# Patient Record
Sex: Female | Born: 1980 | Race: White | Hispanic: No | Marital: Married | Smoking: Former smoker
Health system: Southern US, Community
[De-identification: ages and names within clinical notes are randomized; demographics above are authoritative.]

---

## 2005-08-24 ENCOUNTER — Other Ambulatory Visit: Admission: RE | Admit: 2005-08-24 | Discharge: 2005-08-24 | Payer: Self-pay | Admitting: Obstetrics and Gynecology

## 2014-06-27 ENCOUNTER — Emergency Department (HOSPITAL_BASED_OUTPATIENT_CLINIC_OR_DEPARTMENT_OTHER)
Admission: EM | Admit: 2014-06-27 | Discharge: 2014-06-27 | Disposition: A | Discharge status: Home / Self Care | Payer: BC Managed Care – PPO | Attending: Emergency Medicine | Admitting: Emergency Medicine

## 2014-06-27 ENCOUNTER — Emergency Department (HOSPITAL_BASED_OUTPATIENT_CLINIC_OR_DEPARTMENT_OTHER): Payer: BC Managed Care – PPO

## 2014-06-27 ENCOUNTER — Encounter (HOSPITAL_BASED_OUTPATIENT_CLINIC_OR_DEPARTMENT_OTHER): Payer: Self-pay | Admitting: Emergency Medicine

## 2014-06-27 DIAGNOSIS — N80129 Deep endometriosis of ovary, unspecified ovary: Secondary | ICD-10-CM

## 2014-06-27 DIAGNOSIS — R1033 Periumbilical pain: Secondary | ICD-10-CM | POA: Diagnosis present

## 2014-06-27 DIAGNOSIS — N80109 Endometriosis of ovary, unspecified side, unspecified depth: Secondary | ICD-10-CM | POA: Diagnosis not present

## 2014-06-27 DIAGNOSIS — Z87891 Personal history of nicotine dependence: Secondary | ICD-10-CM | POA: Insufficient documentation

## 2014-06-27 DIAGNOSIS — Z3202 Encounter for pregnancy test, result negative: Secondary | ICD-10-CM | POA: Insufficient documentation

## 2014-06-27 DIAGNOSIS — N801 Endometriosis of ovary: Secondary | ICD-10-CM | POA: Diagnosis not present

## 2014-06-27 LAB — WET PREP, GENITAL
Trich, Wet Prep: NONE SEEN
YEAST WET PREP: NONE SEEN

## 2014-06-27 LAB — URINALYSIS, ROUTINE W REFLEX MICROSCOPIC
GLUCOSE, UA: NEGATIVE mg/dL
KETONES UR: 15 mg/dL — AB
Leukocytes, UA: NEGATIVE
Nitrite: NEGATIVE
PROTEIN: NEGATIVE mg/dL
Specific Gravity, Urine: 1.022 (ref 1.005–1.030)
Urobilinogen, UA: 1 mg/dL (ref 0.0–1.0)
pH: 5.5 (ref 5.0–8.0)

## 2014-06-27 LAB — COMPREHENSIVE METABOLIC PANEL
ALBUMIN: 4.4 g/dL (ref 3.5–5.2)
ALT: 7 U/L (ref 0–35)
ANION GAP: 13 (ref 5–15)
AST: 13 U/L (ref 0–37)
Alkaline Phosphatase: 48 U/L (ref 39–117)
BUN: 16 mg/dL (ref 6–23)
CALCIUM: 10.1 mg/dL (ref 8.4–10.5)
CHLORIDE: 104 meq/L (ref 96–112)
CO2: 24 mEq/L (ref 19–32)
CREATININE: 0.8 mg/dL (ref 0.50–1.10)
GFR calc Af Amer: >90 mL/min (ref >90)
Glucose, Bld: 92 mg/dL (ref 70–99)
Potassium: 3.9 mEq/L (ref 3.7–5.3)
Sodium: 141 mEq/L (ref 137–147)
Total Bilirubin: 0.9 mg/dL (ref 0.3–1.2)
Total Protein: 7.4 g/dL (ref 6.0–8.3)

## 2014-06-27 LAB — CBC WITH DIFFERENTIAL/PLATELET
Basophils Absolute: 0 10*3/uL (ref 0.0–0.1)
Basophils Relative: 0 % (ref 0–1)
EOS ABS: 0 10*3/uL (ref 0.0–0.7)
Eosinophils Relative: 0 % (ref 0–5)
HCT: 39.5 % (ref 36.0–46.0)
HEMOGLOBIN: 13.2 g/dL (ref 12.0–15.0)
Lymphocytes Relative: 18 % (ref 12–46)
Lymphs Abs: 1.3 10*3/uL (ref 0.7–4.0)
MCH: 28.4 pg (ref 26.0–34.0)
MCHC: 33.4 g/dL (ref 30.0–36.0)
MCV: 85.1 fL (ref 78.0–100.0)
MONOS PCT: 6 % (ref 3–12)
Monocytes Absolute: 0.5 10*3/uL (ref 0.1–1.0)
NEUTROS ABS: 5.8 10*3/uL (ref 1.7–7.7)
Neutrophils Relative %: 76 % (ref 43–77)
Platelets: 168 10*3/uL (ref 150–400)
RBC: 4.64 MIL/uL (ref 3.87–5.11)
RDW: 13.1 % (ref 11.5–15.5)
WBC: 7.6 10*3/uL (ref 4.0–10.5)

## 2014-06-27 LAB — PREGNANCY, URINE: Preg Test, Ur: NEGATIVE

## 2014-06-27 LAB — URINE MICROSCOPIC-ADD ON

## 2014-06-27 LAB — LIPASE, BLOOD: Lipase: 12 U/L (ref 11–59)

## 2014-06-27 MED ORDER — IOHEXOL 300 MG/ML  SOLN
100.0000 mL | Freq: Once | INTRAMUSCULAR | Status: AC | PRN
  Administered 2014-06-27: 100 mL via INTRAVENOUS

## 2014-06-27 MED ORDER — ONDANSETRON 4 MG PO TBDP
4.0000 mg | ORAL_TABLET | Freq: Once | ORAL | Status: AC
  Administered 2014-06-27: 4 mg via ORAL
  Filled 2014-06-27: qty 1

## 2014-06-27 MED ORDER — IOHEXOL 300 MG/ML  SOLN
50.0000 mL | Freq: Once | INTRAMUSCULAR | Status: AC | PRN
  Administered 2014-06-27: 50 mL via ORAL

## 2014-06-27 MED ORDER — IBUPROFEN 800 MG PO TABS: 800.0000 mg | ORAL_TABLET | Freq: Four times a day (QID) | ORAL | Status: AC | PRN

## 2014-06-27 NOTE — ED Provider Notes (Signed)
CSN: [id number]     Arrival date & time 06/27/14  1029 History   First MD Initiated Contact with Patient 06/27/14 1140     Chief Complaint  Patient presents with  . Abdominal Pain     (Consider location/radiation/quality/duration/timing/severity/associated sxs/prior Treatment) Patient is a 33 y.o. female presenting with abdominal pain. The history is provided by the patient. No language interpreter was used.  Abdominal Pain Pain location:  Periumbilical and RLQ Pain quality: aching   Pain radiates to:  Does not radiate Pain severity:  Moderate Onset quality:  Gradual Duration:  20 hours Timing:  Constant Progression:  Worsening Chronicity:  New Context: not sick contacts and not suspicious food intake   Relieved by:  Nothing Worsened by:  Movement Ineffective treatments:  None tried Associated symptoms: anorexia and nausea   Associated symptoms: no chest pain, no chills, no cough, no diarrhea, no dysuria, no fatigue, no fever, no flatus, no hematemesis, no hematochezia, no shortness of breath, no sore throat, no vaginal discharge and no vomiting     History reviewed. No pertinent past medical history. History reviewed. No pertinent past surgical history. No family history on file. History  Substance Use Topics  . Smoking status: Former Games developer  . Smokeless tobacco: Not on file  . Alcohol Use: Not on file   OB History   Grav Para Term Preterm Abortions TAB SAB Ect Mult Living                 Review of Systems  Constitutional: Negative for fever, chills, diaphoresis, activity change, appetite change and fatigue.  HENT: Negative for congestion, facial swelling, rhinorrhea and sore throat.   Eyes: Negative for photophobia and discharge.  Respiratory: Negative for cough, chest tightness and shortness of breath.   Cardiovascular: Negative for chest pain, palpitations and leg swelling.  Gastrointestinal: Positive for nausea, abdominal pain and anorexia. Negative for vomiting,  diarrhea, hematochezia, flatus and hematemesis.  Endocrine: Negative for polydipsia and polyuria.  Genitourinary: Negative for dysuria, frequency, vaginal discharge, difficulty urinating and pelvic pain.  Musculoskeletal: Negative for arthralgias, back pain, neck pain and neck stiffness.  Skin: Negative for color change and wound.  Allergic/Immunologic: Negative for immunocompromised state.  Neurological: Negative for facial asymmetry, weakness, numbness and headaches.  Hematological: Does not bruise/bleed easily.  Psychiatric/Behavioral: Negative for confusion and agitation.      Allergies  Review of patient's allergies indicates no known allergies.  Home Medications   Prior to Admission medications   Medication Sig Start Date End Date Taking? Authorizing Provider  ibuprofen (ADVIL,MOTRIN) 800 MG tablet Take 1 tablet (800 mg total) by mouth every 6 (six) hours as needed. 06/27/14   Toy Cookey, MD   BP 124/78  Pulse 76  Temp(Src) 98 F (36.7 C) (Oral)  Resp 17  Wt 108 lb (48.988 kg)  SpO2 100%  LMP 06/25/2014 Physical Exam  Constitutional: She is oriented to person, place, and time. She appears well-developed and well-nourished. No distress.  HENT:  Head: Normocephalic and atraumatic.  Mouth/Throat: No oropharyngeal exudate.  Eyes: Pupils are equal, round, and reactive to light.  Neck: Normal range of motion. Neck supple.  Cardiovascular: Normal rate, regular rhythm and normal heart sounds.  Exam reveals no gallop and no friction rub.   No murmur heard. Pulmonary/Chest: Effort normal and breath sounds normal. No respiratory distress. She has no wheezes. She has no rales.  Abdominal: Soft. Bowel sounds are normal. She exhibits no distension and no mass. There is tenderness in  the right lower quadrant and periumbilical area. There is no rebound and no guarding.  Musculoskeletal: Normal range of motion. She exhibits no edema and no tenderness.  Neurological: She is alert and  oriented to person, place, and time.  Skin: Skin is warm and dry.  Psychiatric: She has a normal mood and affect.    ED Course  Procedures (including critical care time) Labs Review Labs Reviewed  WET PREP, GENITAL - Abnormal; Notable for the following:    Clue Cells Wet Prep HPF POC FEW (*)    WBC, Wet Prep HPF POC FEW (*)    All other components within normal limits  URINALYSIS, ROUTINE W REFLEX MICROSCOPIC - Abnormal; Notable for the following:    Color, Urine AMBER (*)    APPearance CLOUDY (*)    Hgb urine dipstick LARGE (*)    Bilirubin Urine SMALL (*)    Ketones, ur 15 (*)    All other components within normal limits  URINE MICROSCOPIC-ADD ON - Abnormal; Notable for the following:    Bacteria, UA MANY (*)    All other components within normal limits  GC/CHLAMYDIA PROBE AMP  PREGNANCY, URINE  CBC WITH DIFFERENTIAL  COMPREHENSIVE METABOLIC PANEL  LIPASE, BLOOD    Imaging Review US Transvaginal Non-ob  06/27/2014   CLINICAL DATA:  Infertility, right lower quadrant pelvic pain for 1 day. Right ovarian/ adnexal mass on prior exam.  EXAM: TRANSABDOMINAL AND TRANSVAGINAL ULTRASOUND OF PELVIS  TECHNIQUE: Both transabdominal and transvaginal ultrasound examinations of the pelvis were performed. Transabdominal technique was performed for global imaging of the pelvis including uterus, ovaries, adnexal regions, and pelvic cul-de-sac. It was necessary to proceed with endovaginal exam following the transabdominal exam to visualize the endometrium and ovaries.  COMPARISON:  CT same date  FINDINGS: Uterus  Measurements: 8.1 x 4.6 x 3.7 cm. Anteverted, anteflexed. No fibroids or other mass visualized.  Endometrium  Thickness: 6 mm. No focal abnormality. Minimally internally inhomogeneous.  Right ovary  Measurements: 4.8 x 4.3 x 3.7 cm, with internal dominant cystic structure with internal fluid fluid level and probable peripheral retractile clot measuring 4.0 x 3.7 x 3.3 cm. This finding  corresponds to the finding at today's previous exam. There is also an exophytic structure within the right ovary with homogeneous low level internal echoes corresponding to the previously reported ovarian cyst or paraovarian cyst measuring 1.8 x 1.3 cm. This has an appearance more typical for endometrioma.  Left ovary  Measurements: 3.7 x 2.6 x 2.0 cm. Cyst with low level internal echoes measures to 1.4 x 1.4 x 1.0 cm  Other findings  Moderate free fluid is present in the cul-de-sac.  IMPRESSION: Dominant right ovarian cystic lesion with an appearance most typical for evolving endometrioma, with other endometriomas probably present bilaterally. Endometriosis is associated with infertility, as the patient reports in her clinical history. Further characterization with followup pelvic ultrasound in 4-6 weeks during the week following the patient's menses would be helpful for initial surveillance and to allow for possible resolution of a hemorrhagic cyst, which may appear similar. If these findings persist, then pelvic MRI would be recommended for more global evaluation for the extent of disease and to determine possible therapeutic options.   Electronically Signed   By: Christiana Pellant M.D.   On: 06/27/2014 16:31   US Pelvis Complete  06/27/2014   CLINICAL DATA:  Infertility, right lower quadrant pelvic pain for 1 day. Right ovarian/ adnexal mass on prior exam.  EXAM: TRANSABDOMINAL AND TRANSVAGINAL  ULTRASOUND OF PELVIS  TECHNIQUE: Both transabdominal and transvaginal ultrasound examinations of the pelvis were performed. Transabdominal technique was performed for global imaging of the pelvis including uterus, ovaries, adnexal regions, and pelvic cul-de-sac. It was necessary to proceed with endovaginal exam following the transabdominal exam to visualize the endometrium and ovaries.  COMPARISON:  CT same date  FINDINGS: Uterus  Measurements: 8.1 x 4.6 x 3.7 cm. Anteverted, anteflexed. No fibroids or other mass  visualized.  Endometrium  Thickness: 6 mm. No focal abnormality. Minimally internally inhomogeneous.  Right ovary  Measurements: 4.8 x 4.3 x 3.7 cm, with internal dominant cystic structure with internal fluid fluid level and probable peripheral retractile clot measuring 4.0 x 3.7 x 3.3 cm. This finding corresponds to the finding at today's previous exam. There is also an exophytic structure within the right ovary with homogeneous low level internal echoes corresponding to the previously reported ovarian cyst or paraovarian cyst measuring 1.8 x 1.3 cm. This has an appearance more typical for endometrioma.  Left ovary  Measurements: 3.7 x 2.6 x 2.0 cm. Cyst with low level internal echoes measures to 1.4 x 1.4 x 1.0 cm  Other findings  Moderate free fluid is present in the cul-de-sac.  IMPRESSION: Dominant right ovarian cystic lesion with an appearance most typical for evolving endometrioma, with other endometriomas probably present bilaterally. Endometriosis is associated with infertility, as the patient reports in her clinical history. Further characterization with followup pelvic ultrasound in 4-6 weeks during the week following the patient's menses would be helpful for initial surveillance and to allow for possible resolution of a hemorrhagic cyst, which may appear similar. If these findings persist, then pelvic MRI would be recommended for more global evaluation for the extent of disease and to determine possible therapeutic options.   Electronically Signed   By: Christiana Pellant M.D.   On: 06/27/2014 16:31   Ct Abdomen Pelvis W Contrast  06/27/2014   CLINICAL DATA:  Right lower quadrant abdominal pain, nausea.  EXAM: CT ABDOMEN AND PELVIS WITH CONTRAST  TECHNIQUE: Multidetector CT imaging of the abdomen and pelvis was performed using the standard protocol following bolus administration of intravenous contrast.  CONTRAST:  OMNIPAQUE IOHEXOL 300 MG/ML SOLN, 50mL OMNIPAQUE IOHEXOL 300 MG/ML SOLN  COMPARISON:   None.  FINDINGS: The lung bases are clear. 5 mm cyst noted at the dome of the left hepatic lobe image 9. 2 mm too small to characterize posterior segment right hepatic lobe hypodense lesion is identified image 19. 1 cm right lower renal pole cortical cyst identified image 39. Gallbladder, adrenal glands, spleen, and pancreas are unremarkable. 3 mm nonobstructing left lower renal pole calculus identified image 31. Too small to characterize 5 mm left upper renal pole cortical hypodense lesion is identified image 19.  Uterus and left ovary are normal. 4.1 x 3.4 cm hypodense right ovarian lesion is identified measuring 37 Hounsfield units image 64. Probable adjacent exophytic follicle noted along the right pelvic sidewall image 60, versus paraovarian cyst. The appendix is not visualized. However, there is no dilated tubular structure identified in the right lower quadrant. Bladder is normal and decompressed. No acute osseous finding.  IMPRESSION: Probable right ovarian physiologic cyst although tubo-ovarian abscess or endometrioma could appear similar. The appendix is not identified and appendiceal rupture with abscess formation is possible but less likely given the apparent contiguity of the right adnexal cystic structure with the expected location and appearance of the ovary. Further evaluation with pelvic ultrasound transabdominal/ transvaginal technique is recommended  for further characterization.   Electronically Signed   By: Christiana Pellant M.D.   On: 06/27/2014 14:36     EKG Interpretation None      MDM   Final diagnoses:  Endometrioma of ovary    Pt is a 33 y.o. female with Pmhx as above who presents with abdominal pain since 2:30 PM yesterday. She states pain initially was periumbilical and now has been more right lower quadrant pain. She has some associated nausea and anorexia. No vomiting. No fevers, no dysuria, no diarrhea. She states pain is worse with movement and better when still. She is  currently on her menstrual cycle. On physical exam vitals are stable and patient is in no acute distress. She has tenderness to palpation in the right lower quadrant and periumbilical area. No rebound or guarding. CT abdomen pelvis with contrast is ordered and shows probably L ovarian cyst, cannot r/o TOA, or ruptured appendix with abscess formation. Pt will get TVUS. On pelvic exam, patient has right adnexal tenderness. She does not want anything for pain.    On transvaginal ultrasound patient doesn't have a dominant right ovarian cystic lesion with appearance most typical for evolving endometrioma with other endometriomas probably present bilaterally radiology recommending followup ultrasound in 4-6 weeks following patient's menses. The patient can followup with her GYN for further testing. Motrin given for pain. Return precautions given for new or worsening symptoms including worsening pain, fever, inability to tolerate liquids.    Toy Cookey, MD 06/28/14 [phone]

## 2014-06-27 NOTE — ED Notes (Signed)
PT discharged to home with family. NAD. 

## 2014-06-27 NOTE — ED Notes (Signed)
Patient here with umbilicus abdominal pain x 2 days. Reports that the pain is sharp and tenderness on palpation. No nausea, no vomiting, no diarrhea. Patient also describes the pain as radiating to right side, increased pain with ambulation

## 2014-06-27 NOTE — Discharge Instructions (Signed)
Ovarian Cyst An ovarian cyst is a fluid-filled sac that forms on an ovary. The ovaries are small organs that produce eggs in women. Various types of cysts can form on the ovaries. Most are not cancerous. Many do not cause problems, and they often go away on their own. Some may cause symptoms and require treatment. Common types of ovarian cysts include:  Functional cysts--These cysts may occur every month during the menstrual cycle. This is normal. The cysts usually go away with the next menstrual cycle if the woman does not get pregnant. Usually, there are no symptoms with a functional cyst.  Endometrioma cysts--These cysts form from the tissue that lines the uterus. They are also called "chocolate cysts" because they become filled with blood that turns brown. This type of cyst can cause pain in the lower abdomen during intercourse and with your menstrual period.  Cystadenoma cysts--This type develops from the cells on the outside of the ovary. These cysts can get very big and cause lower abdomen pain and pain with intercourse. This type of cyst can twist on itself, cut off its blood supply, and cause severe pain. It can also easily rupture and cause a lot of pain.  Dermoid cysts--This type of cyst is sometimes found in both ovaries. These cysts may contain different kinds of body tissue, such as skin, teeth, hair, or cartilage. They usually do not cause symptoms unless they get very big.  Theca lutein cysts--These cysts occur when too much of a certain hormone (human chorionic gonadotropin) is produced and overstimulates the ovaries to produce an egg. This is most common after procedures used to assist with the conception of a baby (in vitro fertilization). CAUSES   Fertility drugs can cause a condition in which multiple large cysts are formed on the ovaries. This is called ovarian hyperstimulation syndrome.  A condition called polycystic ovary syndrome can cause hormonal imbalances that can lead to  nonfunctional ovarian cysts. SIGNS AND SYMPTOMS  Many ovarian cysts do not cause symptoms. If symptoms are present, they may include:  Pelvic pain or pressure.  Pain in the lower abdomen.  Pain during sexual intercourse.  Increasing girth (swelling) of the abdomen.  Abnormal menstrual periods.  Increasing pain with menstrual periods.  Stopping having menstrual periods without being pregnant. DIAGNOSIS  These cysts are commonly found during a routine or annual pelvic exam. Tests may be ordered to find out more about the cyst. These tests may include:  Ultrasound.  X-ray of the pelvis.  CT scan.  MRI.  Blood tests. TREATMENT  Many ovarian cysts go away on their own without treatment. Your health care provider may want to check your cyst regularly for 2-3 months to see if it changes. For women in menopause, it is particularly important to monitor a cyst closely because of the higher rate of ovarian cancer in menopausal women. When treatment is needed, it may include any of the following:  A procedure to drain the cyst (aspiration). This may be done using a long needle and ultrasound. It can also be done through a laparoscopic procedure. This involves using a thin, lighted tube with a tiny camera on the end (laparoscope) inserted through a small incision.  Surgery to remove the whole cyst. This may be done using laparoscopic surgery or an open surgery involving a larger incision in the lower abdomen.  Hormone treatment or birth control pills. These methods are sometimes used to help dissolve a cyst. HOME CARE INSTRUCTIONS   Only take over-the-counter   or prescription medicines as directed by your health care provider.  Follow up with your health care provider as directed.  Get regular pelvic exams and Pap tests. SEEK MEDICAL CARE IF:   Your periods are late, irregular, or painful, or they stop.  Your pelvic pain or abdominal pain does not go away.  Your abdomen becomes  larger or swollen.  You have pressure on your bladder or trouble emptying your bladder completely.  You have pain during sexual intercourse.  You have feelings of fullness, pressure, or discomfort in your stomach.  You lose weight for no apparent reason.  You feel generally ill.  You become constipated.  You lose your appetite.  You develop acne.  You have an increase in body and facial hair.  You are gaining weight, without changing your exercise and eating habits.  You think you are pregnant. SEEK IMMEDIATE MEDICAL CARE IF:   You have increasing abdominal pain.  You feel sick to your stomach (nauseous), and you throw up (vomit).  You develop a fever that comes on suddenly.  You have abdominal pain during a bowel movement.  Your menstrual periods become heavier than usual. MAKE SURE YOU:  Understand these instructions.  Will watch your condition.  Will get help right away if you are not doing well or get worse. Document Released: 10/08/2005 Document Revised: 10/13/2013 Document Reviewed: 06/15/2013 [employer] Patient Information 2015 [employer]. This information is not intended to replace advice given to you by your health care provider. Make sure you discuss any questions you have with your health care provider.  

## 2014-06-29 LAB — GC/CHLAMYDIA PROBE AMP
CT Probe RNA: NEGATIVE
GC Probe RNA: NEGATIVE

## 2015-07-05 ENCOUNTER — Encounter (HOSPITAL_COMMUNITY): Payer: Self-pay | Admitting: Emergency Medicine

## 2015-07-05 ENCOUNTER — Emergency Department (HOSPITAL_COMMUNITY)
Admission: EM | Admit: 2015-07-05 | Discharge: 2015-07-06 | Disposition: A | Discharge status: Home / Self Care | Payer: BLUE CROSS/BLUE SHIELD | Attending: Emergency Medicine | Admitting: Emergency Medicine

## 2015-07-05 DIAGNOSIS — Z3202 Encounter for pregnancy test, result negative: Secondary | ICD-10-CM | POA: Diagnosis not present

## 2015-07-05 DIAGNOSIS — R110 Nausea: Secondary | ICD-10-CM | POA: Insufficient documentation

## 2015-07-05 DIAGNOSIS — R1031 Right lower quadrant pain: Secondary | ICD-10-CM

## 2015-07-05 DIAGNOSIS — R509 Fever, unspecified: Secondary | ICD-10-CM | POA: Diagnosis not present

## 2015-07-05 DIAGNOSIS — N80129 Deep endometriosis of ovary, unspecified ovary: Secondary | ICD-10-CM

## 2015-07-05 DIAGNOSIS — Z8719 Personal history of other diseases of the digestive system: Secondary | ICD-10-CM | POA: Insufficient documentation

## 2015-07-05 DIAGNOSIS — R102 Pelvic and perineal pain: Secondary | ICD-10-CM

## 2015-07-05 DIAGNOSIS — N809 Endometriosis, unspecified: Secondary | ICD-10-CM | POA: Insufficient documentation

## 2015-07-05 LAB — COMPREHENSIVE METABOLIC PANEL
ALT: 14 U/L (ref 14–54)
AST: 21 U/L (ref 15–41)
Albumin: 5 g/dL (ref 3.5–5.0)
Alkaline Phosphatase: 44 U/L (ref 38–126)
Anion gap: 7 (ref 5–15)
BUN: 16 mg/dL (ref 6–20)
CHLORIDE: 107 mmol/L (ref 101–111)
CO2: 27 mmol/L (ref 22–32)
Calcium: 9.6 mg/dL (ref 8.9–10.3)
Creatinine, Ser: 0.75 mg/dL (ref 0.44–1.00)
Glucose, Bld: 102 mg/dL — ABNORMAL HIGH (ref 65–99)
POTASSIUM: 3.8 mmol/L (ref 3.5–5.1)
Sodium: 141 mmol/L (ref 135–145)
TOTAL PROTEIN: 7.5 g/dL (ref 6.5–8.1)
Total Bilirubin: 0.7 mg/dL (ref 0.3–1.2)

## 2015-07-05 LAB — CBC
HEMATOCRIT: 42 % (ref 36.0–46.0)
Hemoglobin: 13.6 g/dL (ref 12.0–15.0)
MCH: 28.2 pg (ref 26.0–34.0)
MCHC: 32.4 g/dL (ref 30.0–36.0)
MCV: 87 fL (ref 78.0–100.0)
PLATELETS: 199 10*3/uL (ref 150–400)
RBC: 4.83 MIL/uL (ref 3.87–5.11)
RDW: 12.9 % (ref 11.5–15.5)
WBC: 8.5 10*3/uL (ref 4.0–10.5)

## 2015-07-05 NOTE — ED Notes (Signed)
Pt states she has been having right lower abd pain since 5am this morning  Pt states she was seen by her PCP and sent here to rule out appendicitis  Pt states her WBC was elevated at the dr office and she was running a fever earlier today

## 2015-07-05 NOTE — ED Provider Notes (Signed)
CSN: [id number]     Arrival date & time 07/05/15  1948 History   First MD Initiated Contact with Patient 07/05/15 2304     Chief Complaint  Patient presents with  . Abdominal Pain     (Consider location/radiation/quality/duration/timing/severity/associated sxs/prior Treatment) Patient is a 34 y.o. female presenting with abdominal pain. The history is provided by the patient. No language interpreter was used.  Abdominal Pain Associated symptoms: fever and nausea   Associated symptoms: no chest pain, no chills, no diarrhea, no shortness of breath and no vomiting   Debra Sellers is a 34 y.o female who presents with increasing right lower quadrant abdominal pain that began 3-4 days ago. She states that she was evaluated by her primary care physician and had lab work done and that her PCP sent her to the ED to be evaluated for appendicitis. She states at the physician's office she had a temperature of 99.4 and that her white blood cell count was at the higher end of normal. She also had a pelvic ultrasound done in the past which showed right ovarian cyst. She has had problems in the past with ovarian cysts that presented similar. Her last bowel movement was yesterday. She denies any diaphoresis, vomiting, diarrhea, constipation, dysuria, hematuria, vaginal discharge or bleeding. Her last menstrual period was 3 weeks ago. She is not on birth control.  History reviewed. No pertinent past medical history. History reviewed. No pertinent past surgical history. Family History  Problem Relation Age of Onset  . Hypertension Mother   . Hyperlipidemia Mother   . Fibromyalgia Mother    Social History  Substance Use Topics  . Smoking status: Former Games developer  . Smokeless tobacco: None  . Alcohol Use: Yes     Comment: occ   OB History    No data available     Review of Systems  Constitutional: Positive for fever. Negative for chills.  Respiratory: Negative for shortness of breath.     Cardiovascular: Negative for chest pain.  Gastrointestinal: Positive for nausea and abdominal pain. Negative for vomiting, diarrhea and blood in stool.  Genitourinary: Negative for menstrual problem.  All other systems reviewed and are negative.     Allergies  Review of patient's allergies indicates no known allergies.  Home Medications   Prior to Admission medications   Medication Sig Start Date End Date Taking? Authorizing Provider  acetaminophen (TYLENOL) 500 MG tablet Take 500 mg by mouth every 6 (six) hours as needed for headache.   Yes Historical Provider, MD  aspirin-acetaminophen-caffeine (EXCEDRIN MIGRAINE) [phone] MG per tablet Take 1 tablet by mouth every 6 (six) hours as needed for headache.   Yes Historical Provider, MD  ibuprofen (ADVIL,MOTRIN) 800 MG tablet Take 1 tablet (800 mg total) by mouth every 6 (six) hours as needed. 06/27/14  Yes Toy Cookey, MD  naproxen sodium (ANAPROX) 220 MG tablet Take 440 mg by mouth 2 (two) times daily as needed (pain).   Yes Historical Provider, MD   BP 108/70 mmHg  Pulse 91  Temp(Src) 98.3 F (36.8 C) (Oral)  Resp 18  Ht 5\' 6"  (1.676 m)  Wt 107 lb (48.535 kg)  BMI 17.28 kg/m2  SpO2 100%  LMP 06/13/2015 (Approximate) Physical Exam  Constitutional: She is oriented to person, place, and time. She appears well-developed and well-nourished. No distress.  HENT:  Head: Normocephalic and atraumatic.  Eyes: Conjunctivae are normal.  Neck: Normal range of motion. Neck supple.  Cardiovascular: Normal rate, regular rhythm and normal heart sounds.  Pulmonary/Chest: Effort normal. No respiratory distress. She has no wheezes. She has no rales.  Abdominal: Soft. Normal appearance. She exhibits no distension and no mass. There is no tenderness. There is no rebound and no guarding.    Mild tenderness to deep palpation as diagramed in the RLQ. No guarding or rebound.  Negative heal tap or obturators sign.    Thin appearing.    Musculoskeletal: Normal range of motion.  Neurological: She is alert and oriented to person, place, and time.  Skin: Skin is warm and dry.  Nursing note and vitals reviewed.   ED Course  Procedures (including critical care time) Labs Review Labs Reviewed  COMPREHENSIVE METABOLIC PANEL - Abnormal; Notable for the following:    Glucose, Bld 102 (*)    All other components within normal limits  URINALYSIS, ROUTINE W REFLEX MICROSCOPIC (NOT AT [facility]) - Abnormal; Notable for the following:    APPearance CLOUDY (*)    All other components within normal limits  CBC  PREGNANCY, URINE    Imaging Review US Transvaginal Non-ob  07/06/2015   CLINICAL DATA:  34 year old female with an extensive and endometriosis.  EXAM: TRANSABDOMINAL AND TRANSVAGINAL ULTRASOUND OF PELVIS  TECHNIQUE: Both transabdominal and transvaginal ultrasound examinations of the pelvis were performed. Transabdominal technique was performed for global imaging of the pelvis including uterus, ovaries, adnexal regions, and pelvic cul-de-sac. It was necessary to proceed with endovaginal exam following the transabdominal exam to visualize the endometrium and the ovaries.  COMPARISON:  Ultrasound and CT dated 06/27/2014  FINDINGS: Uterus  Measurements: The uterus is anteverted and measures 7.5 x 3.9 x 4.7 cm. No fibroids or other mass visualized.  Endometrium  Thickness: 13 mm with.  No focal abnormality visualized.  Right ovary  Measurements: 4.9 x 3.0 x 4.4 cm. There is a 2.3 x 2.1 x 2.4 cm complex cyst in the right ovary with fluid fluid levels (previously measuring 3.7 x 3.3 by 4.0 cm). In addition there are two simple cysts in the right ovary with the largest measuring 2.9 x 2.2 x 3.0 cm.  Left ovary  Measurements: 3.1 x 2.5 x 3.0 cm. There are two complex hypoechoic cystic lesions in the left ovary with the largest measuring 1.6 x 1.8 x 2.1 cm (previously 1.4 x 1.0 x 1.4 cm. Multiple small simple cyst or follicle is noted in the left  ovary.  No flow is identified within the complex lesion of the ovaries on Doppler images.  Other findings  No free fluid.  IMPRESSION: Bilateral complex cystic ovarian lesion as described likely representing an endometrioma and less likely hemorrhagic cysts. MRI is recommended for further evaluation.   Electronically Signed   By: Elgie Collard M.D.   On: 07/06/2015 01:57   US Pelvis Complete  07/06/2015   CLINICAL DATA:  34 year old female with an extensive and endometriosis.  EXAM: TRANSABDOMINAL AND TRANSVAGINAL ULTRASOUND OF PELVIS  TECHNIQUE: Both transabdominal and transvaginal ultrasound examinations of the pelvis were performed. Transabdominal technique was performed for global imaging of the pelvis including uterus, ovaries, adnexal regions, and pelvic cul-de-sac. It was necessary to proceed with endovaginal exam following the transabdominal exam to visualize the endometrium and the ovaries.  COMPARISON:  Ultrasound and CT dated 06/27/2014  FINDINGS: Uterus  Measurements: The uterus is anteverted and measures 7.5 x 3.9 x 4.7 cm. No fibroids or other mass visualized.  Endometrium  Thickness: 13 mm with.  No focal abnormality visualized.  Right ovary  Measurements: 4.9 x 3.0 x 4.4  cm. There is a 2.3 x 2.1 x 2.4 cm complex cyst in the right ovary with fluid fluid levels (previously measuring 3.7 x 3.3 by 4.0 cm). In addition there are two simple cysts in the right ovary with the largest measuring 2.9 x 2.2 x 3.0 cm.  Left ovary  Measurements: 3.1 x 2.5 x 3.0 cm. There are two complex hypoechoic cystic lesions in the left ovary with the largest measuring 1.6 x 1.8 x 2.1 cm (previously 1.4 x 1.0 x 1.4 cm. Multiple small simple cyst or follicle is noted in the left ovary.  No flow is identified within the complex lesion of the ovaries on Doppler images.  Other findings  No free fluid.  IMPRESSION: Bilateral complex cystic ovarian lesion as described likely representing an endometrioma and less likely  hemorrhagic cysts. MRI is recommended for further evaluation.   Electronically Signed   By: Elgie Collard M.D.   On: 07/06/2015 01:57   I have personally reviewed and evaluated these images and lab results as part of my medical decision-making.   EKG Interpretation None      MDM   Final diagnoses:  Right lower quadrant abdominal pain  Endometrioma   Patient presents for right lower quadrant pelvic pain for the past 3-4 days. She states she has had this intermittently for the last several months but was evaluated today at her PCP and sent to the ED. She refused pain and nausea medication.  Medications - No data to display  She is afebrile well-appearing. Her labs are unremarkable. She has had no nausea or vomiting while in the ED.   My suspicion for appendicitis or colitis is very low.  US of the pelvis shows bilateral complex ovarian lesions that represent endometrioma and less likely hemorrhagic cysts.  She is aware of ovarian problems but has not had any intervention. She has a follow-up appointment in 2 days with her OB/GYN.  I discussed return precautions and the patient and she verbally agrees with the plan.        Catha Gosselin, PA-C 07/06/15 0211  Derwood Kaplan, MD 07/07/15 [phone]

## 2015-07-06 ENCOUNTER — Emergency Department (HOSPITAL_COMMUNITY): Payer: BLUE CROSS/BLUE SHIELD

## 2015-07-06 LAB — URINALYSIS, ROUTINE W REFLEX MICROSCOPIC
Bilirubin Urine: NEGATIVE
Glucose, UA: NEGATIVE mg/dL
Hgb urine dipstick: NEGATIVE
Ketones, ur: NEGATIVE mg/dL
LEUKOCYTES UA: NEGATIVE
NITRITE: NEGATIVE
PH: 6 (ref 5.0–8.0)
Protein, ur: NEGATIVE mg/dL
Specific Gravity, Urine: 1.027 (ref 1.005–1.030)
UROBILINOGEN UA: 1 mg/dL (ref 0.0–1.0)

## 2015-07-06 LAB — PREGNANCY, URINE: Preg Test, Ur: NEGATIVE

## 2015-07-06 NOTE — Discharge Instructions (Signed)
Abdominal Pain Follow up with OB/GYN or your primary care physician. Return for fever, vomiting, increased abdominal pain or inability to pass. Many things can cause abdominal pain. Usually, abdominal pain is not caused by a disease and will improve without treatment. It can often be observed and treated at home. Your health care provider will do a physical exam and possibly order blood tests and X-rays to help determine the seriousness of your pain. However, in many cases, more time must pass before a clear cause of the pain can be found. Before that point, your health care provider may not know if you need more testing or further treatment. HOME CARE INSTRUCTIONS  Monitor your abdominal pain for any changes. The following actions may help to alleviate any discomfort you are experiencing:  Only take over-the-counter or prescription medicines as directed by your health care provider.  Do not take laxatives unless directed to do so by your health care provider.  Try a clear liquid diet (broth, tea, or water) as directed by your health care provider. Slowly move to a bland diet as tolerated. SEEK MEDICAL CARE IF:  You have unexplained abdominal pain.  You have abdominal pain associated with nausea or diarrhea.  You have pain when you urinate or have a bowel movement.  You experience abdominal pain that wakes you in the night.  You have abdominal pain that is worsened or improved by eating food.  You have abdominal pain that is worsened with eating fatty foods.  You have a fever. SEEK IMMEDIATE MEDICAL CARE IF:   Your pain does not go away within 2 hours.  You keep throwing up (vomiting).  Your pain is felt only in portions of the abdomen, such as the right side or the left lower portion of the abdomen.  You pass bloody or black tarry stools. MAKE SURE YOU:  Understand these instructions.   Will watch your condition.   Will get help right away if you are not doing well or get  worse.  Document Released: 07/18/2005 Document Revised: 10/13/2013 Document Reviewed: 06/17/2013 [facility] Patient Information 2015 [place], [place]. This information is not intended to replace advice given to you by your health care provider. Make sure you discuss any questions you have with your health care provider.

## 2017-02-23 IMAGING — US US PELVIS COMPLETE
1 series · 13 of 25 positions shown · non-contrast
Comparison: Ultrasound and CT dated 06/27/2014

CLINICAL DATA: 34-year-old female with an extensive and
endometriosis.

EXAM:
TRANSABDOMINAL AND TRANSVAGINAL ULTRASOUND OF PELVIS
TECHNIQUE: Both transabdominal and transvaginal ultrasound examinations of the
pelvis were performed. Transabdominal technique was performed for
global imaging of the pelvis including uterus, ovaries, adnexal
regions, and pelvic cul-de-sac. It was necessary to proceed with
endovaginal exam following the transabdominal exam to visualize the
endometrium and the ovaries.

[Series 1: us pelvis complete · 0.21mm/px · 13 of 76 slices shown]
[im 1/76]
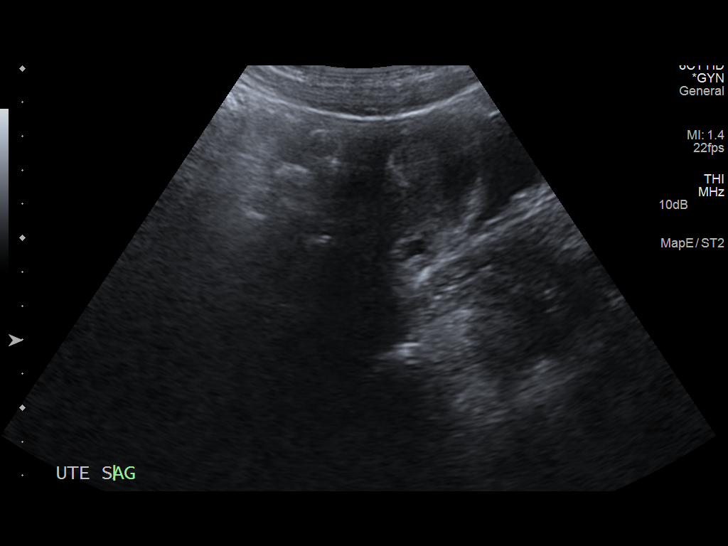
[im 7/76]
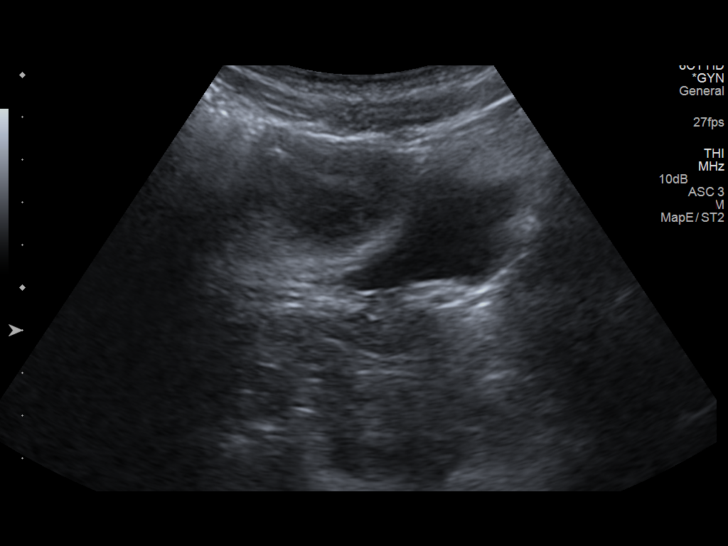
[im 13/76]
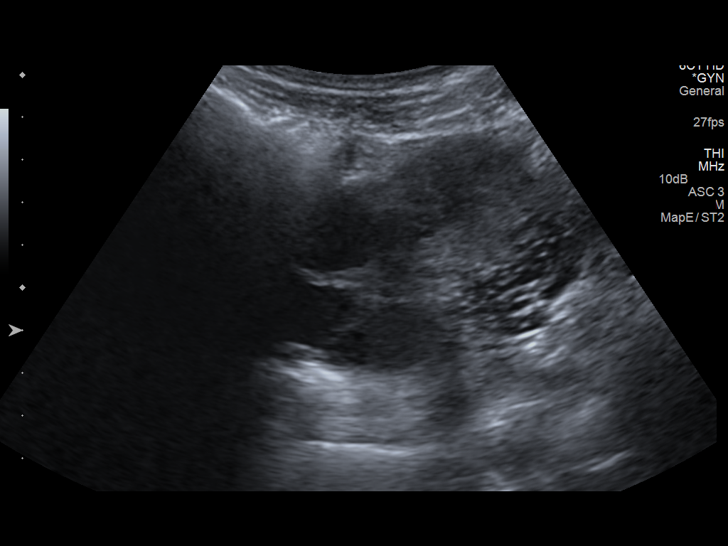
[im 19/76]
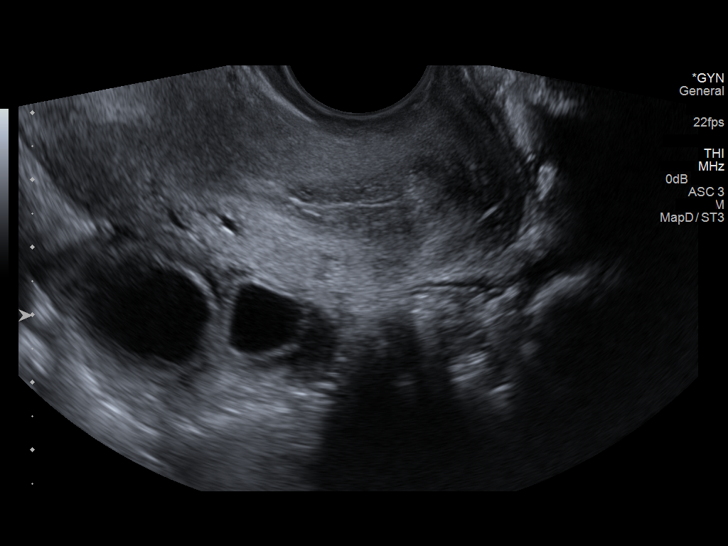
[im 26/76]
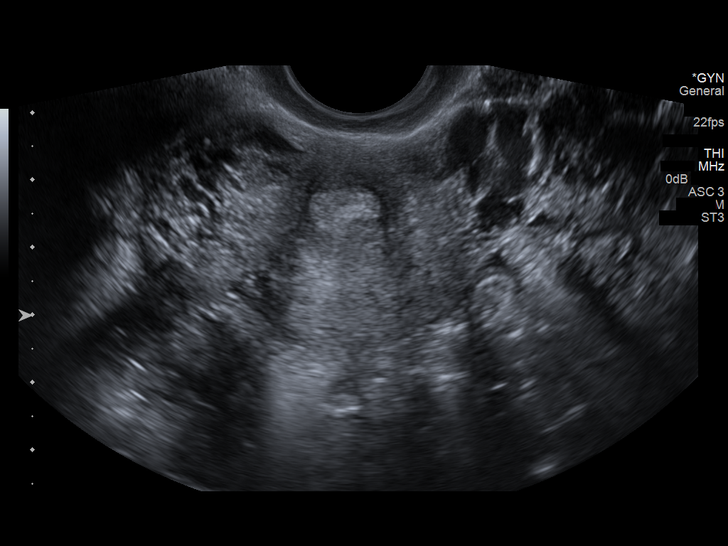
[im 32/76]
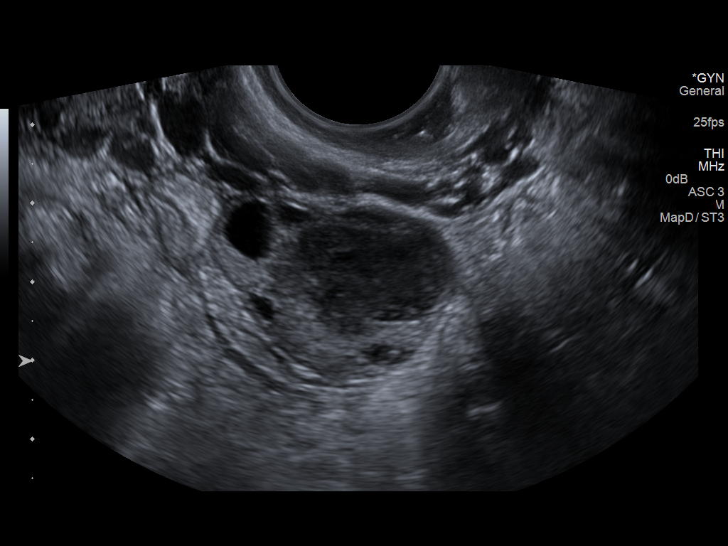
[im 38/76]
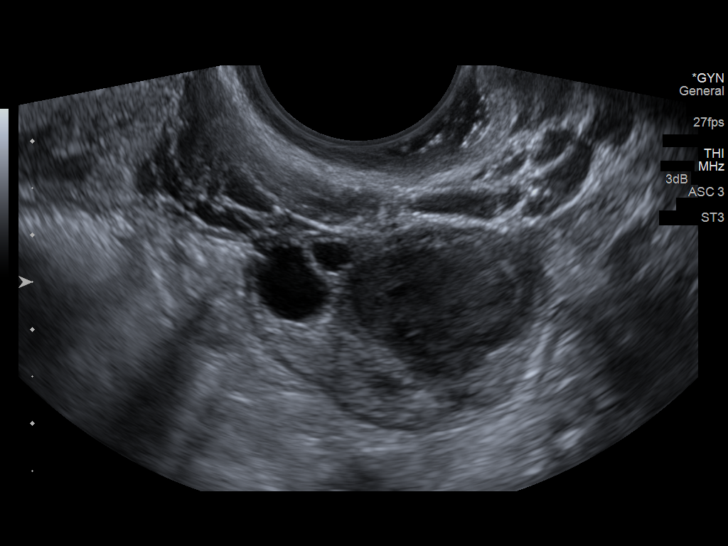
[im 44/76]
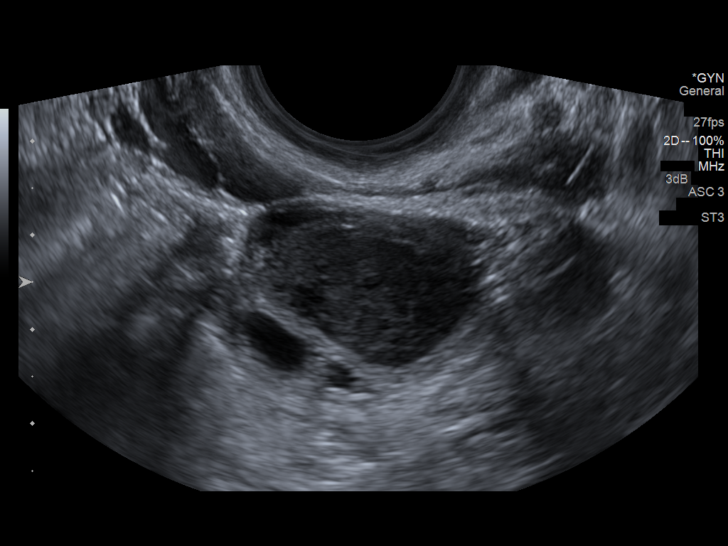
[im 51/76]
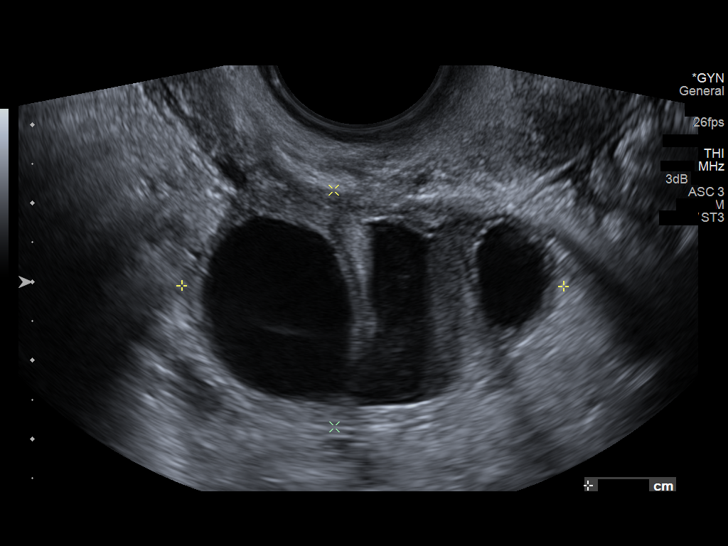
[im 57/76]
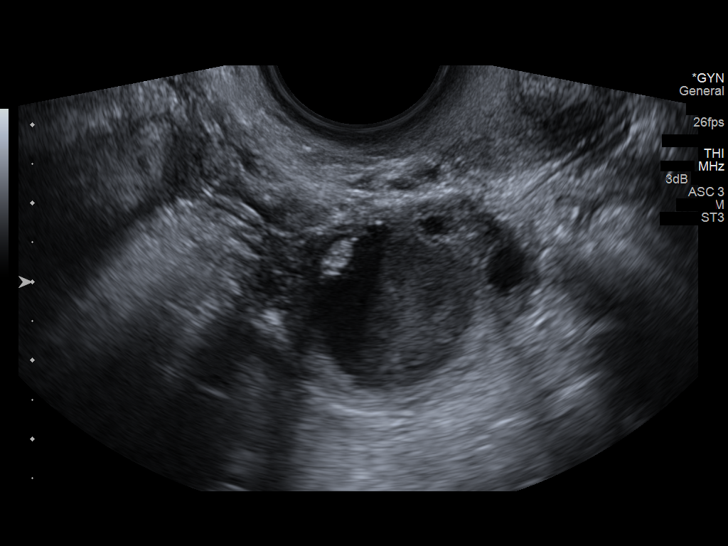
[im 63/76]
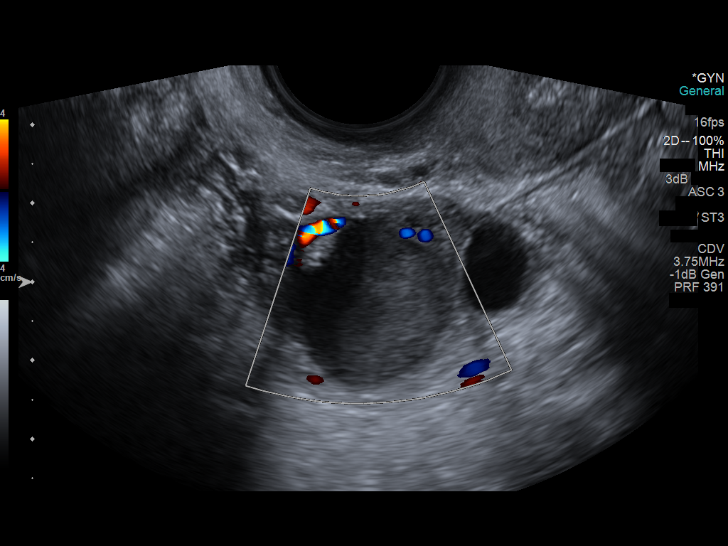
[im 69/76]
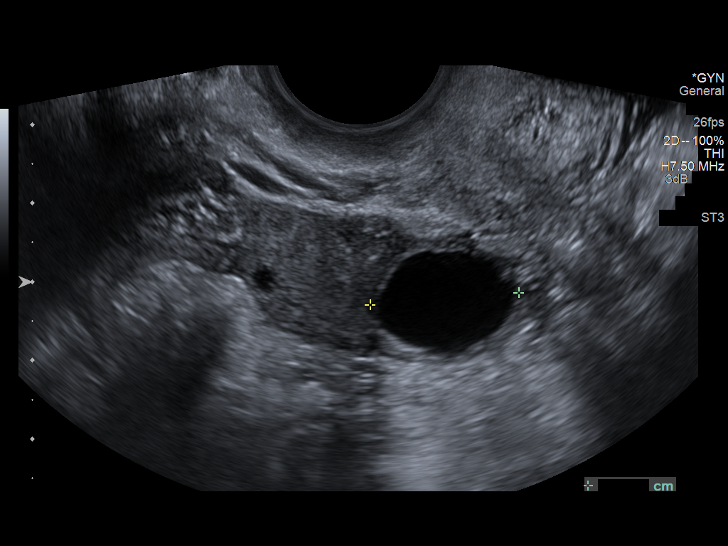
[im 76/76]
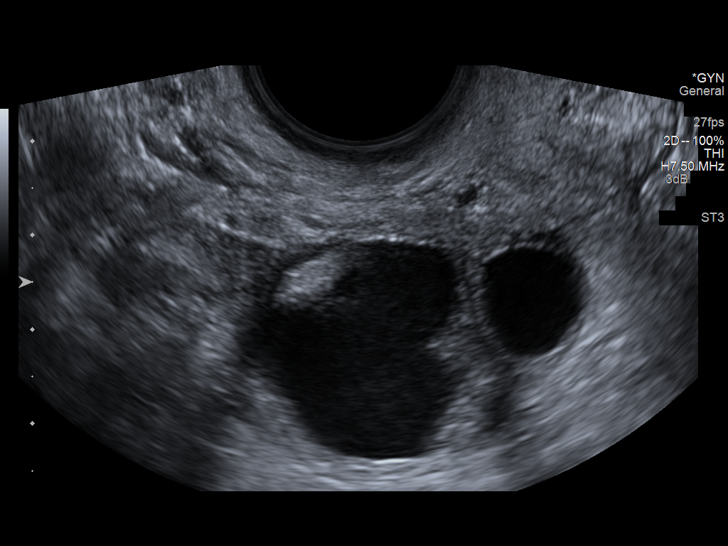

[13 of 25 positions shown; findings below may reference images not displayed]

FINDINGS: Uterus

Measurements: The uterus is anteverted and measures 7.5 x 3.9 x
cm. No fibroids or other mass visualized.

Endometrium

Thickness: 13 mm with.  No focal abnormality visualized.

Right ovary

Measurements: 4.9 x 3.0 x 4.4 cm.. There is a 2.3 x 2.1 x 2.4 cm
complex cyst in the right ovary with fluid fluid levels (previously
measuring 3.7 x 3.3 by 4.0 cm). In addition there are two simple
cysts in the right ovary with the largest measuring 2.9 x 2.2 x
cm.

Left ovary

Measurements: 3.1 x 2.5 x 3.0 cm. There are two complex hypoechoic
cystic lesions in the left ovary with the largest measuring 1.6 x
1.8 x 2.1 cm (previously 1.4 x 1.0 x 1.4 cm. Multiple small simple
cyst or follicle is noted in the left ovary.

No flow is identified within the complex lesion of the ovaries on
Doppler images.

Other findings

No free fluid.
IMPRESSION: Bilateral complex cystic ovarian lesion as described likely
representing an endometrioma and less likely hemorrhagic cysts. MRI
is recommended for further evaluation.
